# Patient Record
Sex: Male | Born: 1970 | Race: Black or African American | Hispanic: No | Marital: Single | Smoking: Former smoker
Health system: Southern US, Community
[De-identification: ages and names within clinical notes are randomized; demographics above are authoritative.]

## PROBLEM LIST (undated history)

## (undated) ENCOUNTER — Emergency Department (HOSPITAL_COMMUNITY): Discharge status: Absent without leave | Payer: No Typology Code available for payment source

## (undated) DIAGNOSIS — F419 Anxiety disorder, unspecified: Secondary | ICD-10-CM

## (undated) DIAGNOSIS — F32A Depression, unspecified: Secondary | ICD-10-CM

## (undated) DIAGNOSIS — W3400XA Accidental discharge from unspecified firearms or gun, initial encounter: Secondary | ICD-10-CM

## (undated) DIAGNOSIS — Y249XXA Unspecified firearm discharge, undetermined intent, initial encounter: Secondary | ICD-10-CM

## (undated) DIAGNOSIS — F329 Major depressive disorder, single episode, unspecified: Secondary | ICD-10-CM

## (undated) HISTORY — PX: OTHER SURGICAL HISTORY: SHX169

## (undated) HISTORY — PX: STOMACH FOREIGN BODY REMOVAL: SHX2449

---

## 1999-02-26 ENCOUNTER — Encounter: Payer: Self-pay | Admitting: Pulmonary Disease

## 1999-02-26 ENCOUNTER — Encounter: Payer: Self-pay | Admitting: General Surgery

## 1999-02-26 ENCOUNTER — Inpatient Hospital Stay (HOSPITAL_COMMUNITY): Admission: AD | Admit: 1999-02-26 | Discharge: 1999-03-09 | Payer: Self-pay

## 1999-02-27 ENCOUNTER — Encounter: Payer: Self-pay | Admitting: General Surgery

## 1999-02-28 ENCOUNTER — Encounter: Payer: Self-pay | Admitting: General Surgery

## 1999-03-01 ENCOUNTER — Encounter: Payer: Self-pay | Admitting: General Surgery

## 1999-03-03 ENCOUNTER — Encounter: Payer: Self-pay | Admitting: General Surgery

## 1999-03-04 ENCOUNTER — Encounter: Payer: Self-pay | Admitting: General Surgery

## 1999-03-06 ENCOUNTER — Encounter: Payer: Self-pay | Admitting: Surgery

## 1999-03-06 ENCOUNTER — Encounter: Payer: Self-pay | Admitting: General Surgery

## 1999-03-07 ENCOUNTER — Encounter: Payer: Self-pay | Admitting: General Surgery

## 1999-03-07 ENCOUNTER — Encounter: Payer: Self-pay | Admitting: Surgery

## 1999-04-05 ENCOUNTER — Emergency Department (HOSPITAL_COMMUNITY): Admission: EM | Admit: 1999-04-05 | Discharge: 1999-04-05 | Payer: Self-pay | Admitting: Emergency Medicine

## 1999-04-10 ENCOUNTER — Encounter: Admission: RE | Admit: 1999-04-10 | Discharge: 1999-04-15 | Payer: Self-pay | Admitting: Orthopedic Surgery

## 1999-04-30 ENCOUNTER — Encounter: Admission: RE | Admit: 1999-04-30 | Discharge: 1999-05-30 | Payer: Self-pay | Admitting: Orthopedic Surgery

## 1999-05-08 ENCOUNTER — Encounter: Payer: Self-pay | Admitting: *Deleted

## 1999-05-08 ENCOUNTER — Emergency Department (HOSPITAL_COMMUNITY): Admission: EM | Admit: 1999-05-08 | Discharge: 1999-05-08 | Payer: Self-pay | Admitting: *Deleted

## 2000-09-12 ENCOUNTER — Emergency Department (HOSPITAL_COMMUNITY): Admission: EM | Admit: 2000-09-12 | Discharge: 2000-09-13 | Payer: Self-pay | Admitting: Internal Medicine

## 2000-09-23 ENCOUNTER — Encounter: Admission: RE | Admit: 2000-09-23 | Discharge: 2000-12-22 | Payer: Self-pay

## 2010-09-20 ENCOUNTER — Emergency Department (HOSPITAL_COMMUNITY)
Admission: EM | Admit: 2010-09-20 | Discharge: 2010-09-20 | Disposition: A | Discharge status: Home / Self Care | Payer: Self-pay | Attending: Emergency Medicine | Admitting: Emergency Medicine

## 2010-09-20 DIAGNOSIS — K089 Disorder of teeth and supporting structures, unspecified: Secondary | ICD-10-CM | POA: Insufficient documentation

## 2010-09-20 DIAGNOSIS — K029 Dental caries, unspecified: Secondary | ICD-10-CM | POA: Insufficient documentation

## 2011-02-20 ENCOUNTER — Telehealth: Payer: Self-pay | Admitting: Dietician

## 2011-02-20 NOTE — Telephone Encounter (Signed)
Opened in error

## 2011-03-15 ENCOUNTER — Emergency Department (HOSPITAL_COMMUNITY)
Admission: EM | Admit: 2011-03-15 | Discharge: 2011-03-16 | Disposition: A | Discharge status: Home / Self Care | Payer: Self-pay | Attending: Emergency Medicine | Admitting: Emergency Medicine

## 2011-03-15 ENCOUNTER — Encounter (HOSPITAL_COMMUNITY): Payer: Self-pay | Admitting: Emergency Medicine

## 2011-03-15 DIAGNOSIS — M79609 Pain in unspecified limb: Secondary | ICD-10-CM | POA: Insufficient documentation

## 2011-03-15 DIAGNOSIS — L02419 Cutaneous abscess of limb, unspecified: Secondary | ICD-10-CM | POA: Insufficient documentation

## 2011-03-15 DIAGNOSIS — M129 Arthropathy, unspecified: Secondary | ICD-10-CM | POA: Insufficient documentation

## 2011-03-15 DIAGNOSIS — M7989 Other specified soft tissue disorders: Secondary | ICD-10-CM | POA: Insufficient documentation

## 2011-03-15 DIAGNOSIS — L0291 Cutaneous abscess, unspecified: Secondary | ICD-10-CM

## 2011-03-15 DIAGNOSIS — R61 Generalized hyperhidrosis: Secondary | ICD-10-CM | POA: Insufficient documentation

## 2011-03-15 DIAGNOSIS — L03119 Cellulitis of unspecified part of limb: Secondary | ICD-10-CM | POA: Insufficient documentation

## 2011-03-15 DIAGNOSIS — R609 Edema, unspecified: Secondary | ICD-10-CM | POA: Insufficient documentation

## 2011-03-15 MED ORDER — OXYCODONE-ACETAMINOPHEN 5-325 MG PO TABS
1.0000 | ORAL_TABLET | Freq: Once | ORAL | Status: AC
  Administered 2011-03-15: 1 via ORAL
  Filled 2011-03-15: qty 1

## 2011-03-15 MED ORDER — SODIUM CHLORIDE 0.9 % IV SOLN
INTRAVENOUS | Status: DC
  Administered 2011-03-15: via INTRAVENOUS
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
## 2011-03-16 ENCOUNTER — Emergency Department (HOSPITAL_COMMUNITY): Payer: Self-pay

## 2011-03-16 LAB — DIFFERENTIAL
Basophils Absolute: 0 10*3/uL (ref 0.0–0.1)
Basophils Relative: 0 % (ref 0–1)
Eosinophils Absolute: 0.4 10*3/uL (ref 0.0–0.7)
Eosinophils Relative: 7 % — ABNORMAL HIGH (ref 0–5)
Lymphocytes Relative: 51 % — ABNORMAL HIGH (ref 12–46)
Lymphs Abs: 3.3 10*3/uL (ref 0.7–4.0)
Monocytes Absolute: 0.5 10*3/uL (ref 0.1–1.0)
Monocytes Relative: 8 % (ref 3–12)
Neutro Abs: 2.2 10*3/uL (ref 1.7–7.7)
Neutrophils Relative %: 34 % — ABNORMAL LOW (ref 43–77)

## 2011-03-16 LAB — CBC
HCT: 36.4 % — ABNORMAL LOW (ref 39.0–52.0)
Hemoglobin: 12.3 g/dL — ABNORMAL LOW (ref 13.0–17.0)
MCH: 33.2 pg (ref 26.0–34.0)
MCHC: 33.8 g/dL (ref 30.0–36.0)
MCV: 98.1 fL (ref 78.0–100.0)
Platelets: 254 10*3/uL (ref 150–400)
RBC: 3.71 MIL/uL — ABNORMAL LOW (ref 4.22–5.81)
RDW: 11.8 % (ref 11.5–15.5)
WBC: 6.5 10*3/uL (ref 4.0–10.5)

## 2011-03-16 LAB — BASIC METABOLIC PANEL
CO2: 28 mEq/L (ref 19–32)
Calcium: 9.1 mg/dL (ref 8.4–10.5)
Chloride: 103 mEq/L (ref 96–112)
Creatinine, Ser: 1.04 mg/dL (ref 0.50–1.35)
Glucose, Bld: 86 mg/dL (ref 70–99)
Sodium: 137 mEq/L (ref 135–145)

## 2011-03-16 MED ORDER — OXYCODONE-ACETAMINOPHEN 5-325 MG PO TABS: 2.0000 | ORAL_TABLET | ORAL | Status: AC | PRN

## 2011-03-16 MED ORDER — IOHEXOL 350 MG/ML SOLN
100.0000 mL | Freq: Once | INTRAVENOUS | Status: AC | PRN
  Administered 2011-03-16: 100 mL via INTRAVENOUS

## 2011-03-16 NOTE — ED Notes (Signed)
Patient transported to CT 

## 2011-03-16 NOTE — ED Notes (Signed)
Patient states 2.5 weeks ago noticed right leg above the knee swollen and painful. ROM intact but painful. Rates pain 8/10 but worse with movement. Respiration even unlabored A&Ox3

## 2011-03-16 NOTE — ED Notes (Signed)
NAD, resp e/u, AOx4, pt states understanding of discharge instructions and denies questions

## 2011-05-04 ENCOUNTER — Other Ambulatory Visit: Payer: Self-pay | Admitting: Orthopedic Surgery

## 2011-05-25 ENCOUNTER — Encounter (HOSPITAL_BASED_OUTPATIENT_CLINIC_OR_DEPARTMENT_OTHER): Payer: Self-pay | Admitting: *Deleted

## 2011-05-27 ENCOUNTER — Encounter (HOSPITAL_BASED_OUTPATIENT_CLINIC_OR_DEPARTMENT_OTHER): Payer: Self-pay | Admitting: Anesthesiology

## 2011-05-29 ENCOUNTER — Encounter (HOSPITAL_BASED_OUTPATIENT_CLINIC_OR_DEPARTMENT_OTHER): Admission: RE | Payer: Self-pay | Source: Ambulatory Visit

## 2011-05-29 ENCOUNTER — Ambulatory Visit (HOSPITAL_BASED_OUTPATIENT_CLINIC_OR_DEPARTMENT_OTHER): Admission: RE | Admit: 2011-05-29 | Payer: Self-pay | Source: Ambulatory Visit | Admitting: Orthopedic Surgery

## 2011-05-29 ENCOUNTER — Encounter (HOSPITAL_BASED_OUTPATIENT_CLINIC_OR_DEPARTMENT_OTHER): Payer: Self-pay | Admitting: Anesthesiology

## 2011-05-29 HISTORY — DX: Accidental discharge from unspecified firearms or gun, initial encounter: W34.00XA

## 2011-05-29 HISTORY — DX: Depression, unspecified: F32.A

## 2011-05-29 HISTORY — DX: Unspecified firearm discharge, undetermined intent, initial encounter: Y24.9XXA

## 2011-05-29 HISTORY — DX: Major depressive disorder, single episode, unspecified: F32.9

## 2011-05-29 HISTORY — DX: Anxiety disorder, unspecified: F41.9

## 2011-05-29 SURGERY — REMOVAL, HARDWARE
Anesthesia: General | Laterality: Right

## 2011-09-07 ENCOUNTER — Emergency Department (HOSPITAL_COMMUNITY): Payer: Self-pay

## 2011-09-07 ENCOUNTER — Encounter (HOSPITAL_COMMUNITY): Payer: Self-pay | Admitting: Emergency Medicine

## 2011-09-07 ENCOUNTER — Emergency Department (HOSPITAL_COMMUNITY)
Admission: EM | Admit: 2011-09-07 | Discharge: 2011-09-07 | Disposition: A | Discharge status: Home / Self Care | Payer: Self-pay | Attending: Emergency Medicine | Admitting: Emergency Medicine

## 2011-09-07 DIAGNOSIS — M25569 Pain in unspecified knee: Secondary | ICD-10-CM | POA: Insufficient documentation

## 2011-09-07 DIAGNOSIS — M79609 Pain in unspecified limb: Secondary | ICD-10-CM | POA: Insufficient documentation

## 2011-09-07 DIAGNOSIS — F411 Generalized anxiety disorder: Secondary | ICD-10-CM | POA: Insufficient documentation

## 2011-09-07 DIAGNOSIS — Z79899 Other long term (current) drug therapy: Secondary | ICD-10-CM | POA: Insufficient documentation

## 2011-09-07 DIAGNOSIS — M25469 Effusion, unspecified knee: Secondary | ICD-10-CM | POA: Insufficient documentation

## 2011-09-07 DIAGNOSIS — F329 Major depressive disorder, single episode, unspecified: Secondary | ICD-10-CM | POA: Insufficient documentation

## 2011-09-07 DIAGNOSIS — F3289 Other specified depressive episodes: Secondary | ICD-10-CM | POA: Insufficient documentation

## 2011-09-07 MED ORDER — HYDROCODONE-ACETAMINOPHEN 5-500 MG PO TABS: 1.0000 | ORAL_TABLET | Freq: Four times a day (QID) | ORAL | Status: AC | PRN

## 2011-09-07 MED ORDER — HYDROCODONE-ACETAMINOPHEN 5-325 MG PO TABS
2.0000 | ORAL_TABLET | Freq: Once | ORAL | Status: AC
  Administered 2011-09-07: 2 via ORAL
  Filled 2011-09-07: qty 2
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
## 2011-11-24 ENCOUNTER — Encounter (HOSPITAL_COMMUNITY): Payer: Self-pay | Admitting: *Deleted

## 2011-11-24 ENCOUNTER — Emergency Department (HOSPITAL_COMMUNITY)
Admission: EM | Admit: 2011-11-24 | Discharge: 2011-11-25 | Disposition: A | Discharge status: Home / Self Care | Payer: Self-pay | Attending: Emergency Medicine | Admitting: Emergency Medicine

## 2011-11-24 DIAGNOSIS — Z87891 Personal history of nicotine dependence: Secondary | ICD-10-CM | POA: Insufficient documentation

## 2011-11-24 DIAGNOSIS — M25569 Pain in unspecified knee: Secondary | ICD-10-CM | POA: Insufficient documentation

## 2011-11-24 DIAGNOSIS — F3289 Other specified depressive episodes: Secondary | ICD-10-CM | POA: Insufficient documentation

## 2011-11-24 DIAGNOSIS — F329 Major depressive disorder, single episode, unspecified: Secondary | ICD-10-CM | POA: Insufficient documentation

## 2011-11-24 DIAGNOSIS — G8929 Other chronic pain: Secondary | ICD-10-CM | POA: Insufficient documentation

## 2011-11-24 DIAGNOSIS — Z880 Allergy status to penicillin: Secondary | ICD-10-CM | POA: Insufficient documentation

## 2011-11-24 DIAGNOSIS — F411 Generalized anxiety disorder: Secondary | ICD-10-CM | POA: Insufficient documentation
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
## 2011-11-25 NOTE — ED Notes (Addendum)
Pt left after seeing EDP.  Pt left without discharge papers and without signing discharge pad.
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
## 2012-05-30 ENCOUNTER — Encounter (HOSPITAL_BASED_OUTPATIENT_CLINIC_OR_DEPARTMENT_OTHER): Payer: Self-pay | Admitting: *Deleted

## 2012-05-30 ENCOUNTER — Emergency Department (HOSPITAL_BASED_OUTPATIENT_CLINIC_OR_DEPARTMENT_OTHER)
Admission: EM | Admit: 2012-05-30 | Discharge: 2012-05-30 | Disposition: A | Discharge status: Home / Self Care | Payer: Self-pay | Attending: Emergency Medicine | Admitting: Emergency Medicine

## 2012-05-30 DIAGNOSIS — Z79899 Other long term (current) drug therapy: Secondary | ICD-10-CM | POA: Insufficient documentation

## 2012-05-30 DIAGNOSIS — K029 Dental caries, unspecified: Secondary | ICD-10-CM

## 2012-05-30 DIAGNOSIS — Z87828 Personal history of other (healed) physical injury and trauma: Secondary | ICD-10-CM | POA: Insufficient documentation

## 2012-05-30 DIAGNOSIS — R110 Nausea: Secondary | ICD-10-CM | POA: Insufficient documentation

## 2012-05-30 DIAGNOSIS — F3289 Other specified depressive episodes: Secondary | ICD-10-CM | POA: Insufficient documentation

## 2012-05-30 DIAGNOSIS — F411 Generalized anxiety disorder: Secondary | ICD-10-CM | POA: Insufficient documentation

## 2012-05-30 DIAGNOSIS — K089 Disorder of teeth and supporting structures, unspecified: Secondary | ICD-10-CM | POA: Insufficient documentation

## 2012-05-30 DIAGNOSIS — Z87891 Personal history of nicotine dependence: Secondary | ICD-10-CM | POA: Insufficient documentation

## 2012-05-30 DIAGNOSIS — K0889 Other specified disorders of teeth and supporting structures: Secondary | ICD-10-CM

## 2012-05-30 DIAGNOSIS — F329 Major depressive disorder, single episode, unspecified: Secondary | ICD-10-CM | POA: Insufficient documentation

## 2012-05-30 MED ORDER — AMOXICILLIN 500 MG PO CAPS: 500.0000 mg | ORAL_CAPSULE | Freq: Three times a day (TID) | ORAL | Status: AC

## 2012-05-30 MED ORDER — ACETAMINOPHEN 500 MG PO TABS: 500.0000 mg | ORAL_TABLET | Freq: Four times a day (QID) | ORAL | Status: AC | PRN

## 2012-05-30 NOTE — ED Provider Notes (Signed)
Medical screening examination/treatment/procedure(s) were performed by non-physician practitioner and as supervising physician I was immediately available for consultation/collaboration.  Juliet Rude. Rubin Payor, MD 05/30/12 [phone]
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
## 2012-05-30 NOTE — ED Notes (Signed)
Pt c/o tooth pain x 2 days

## 2013-01-01 ENCOUNTER — Encounter (HOSPITAL_COMMUNITY): Payer: Self-pay | Admitting: Emergency Medicine

## 2013-01-01 ENCOUNTER — Emergency Department (HOSPITAL_COMMUNITY)
Admission: EM | Admit: 2013-01-01 | Discharge: 2013-01-02 | Disposition: A | Discharge status: Home / Self Care | Payer: Self-pay | Attending: Emergency Medicine | Admitting: Emergency Medicine

## 2013-01-01 DIAGNOSIS — S80811A Abrasion, right lower leg, initial encounter: Secondary | ICD-10-CM

## 2013-01-01 DIAGNOSIS — Z79899 Other long term (current) drug therapy: Secondary | ICD-10-CM | POA: Insufficient documentation

## 2013-01-01 DIAGNOSIS — S0993XA Unspecified injury of face, initial encounter: Secondary | ICD-10-CM | POA: Insufficient documentation

## 2013-01-01 DIAGNOSIS — S7000XA Contusion of unspecified hip, initial encounter: Secondary | ICD-10-CM | POA: Insufficient documentation

## 2013-01-01 DIAGNOSIS — S7001XA Contusion of right hip, initial encounter: Secondary | ICD-10-CM

## 2013-01-01 DIAGNOSIS — S0990XA Unspecified injury of head, initial encounter: Secondary | ICD-10-CM | POA: Insufficient documentation

## 2013-01-01 DIAGNOSIS — Y9389 Activity, other specified: Secondary | ICD-10-CM | POA: Insufficient documentation

## 2013-01-01 DIAGNOSIS — Z880 Allergy status to penicillin: Secondary | ICD-10-CM | POA: Insufficient documentation

## 2013-01-01 DIAGNOSIS — Y92410 Unspecified street and highway as the place of occurrence of the external cause: Secondary | ICD-10-CM | POA: Insufficient documentation

## 2013-01-01 DIAGNOSIS — S9031XA Contusion of right foot, initial encounter: Secondary | ICD-10-CM

## 2013-01-01 DIAGNOSIS — Z792 Long term (current) use of antibiotics: Secondary | ICD-10-CM | POA: Insufficient documentation

## 2013-01-01 DIAGNOSIS — F3289 Other specified depressive episodes: Secondary | ICD-10-CM | POA: Insufficient documentation

## 2013-01-01 DIAGNOSIS — IMO0002 Reserved for concepts with insufficient information to code with codable children: Secondary | ICD-10-CM | POA: Insufficient documentation

## 2013-01-01 DIAGNOSIS — S9030XA Contusion of unspecified foot, initial encounter: Secondary | ICD-10-CM | POA: Insufficient documentation

## 2013-01-01 DIAGNOSIS — F329 Major depressive disorder, single episode, unspecified: Secondary | ICD-10-CM | POA: Insufficient documentation

## 2013-01-01 DIAGNOSIS — Z8679 Personal history of other diseases of the circulatory system: Secondary | ICD-10-CM | POA: Insufficient documentation

## 2013-01-01 DIAGNOSIS — F411 Generalized anxiety disorder: Secondary | ICD-10-CM | POA: Insufficient documentation

## 2013-01-01 DIAGNOSIS — Z87891 Personal history of nicotine dependence: Secondary | ICD-10-CM | POA: Insufficient documentation
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
## 2013-01-02 ENCOUNTER — Emergency Department (HOSPITAL_COMMUNITY): Payer: Self-pay

## 2013-01-02 ENCOUNTER — Encounter (HOSPITAL_COMMUNITY): Payer: Self-pay | Admitting: Radiology

## 2013-01-02 MED ORDER — BACITRACIN 500 UNIT/GM EX OINT
1.0000 "application " | TOPICAL_OINTMENT | Freq: Two times a day (BID) | CUTANEOUS | Status: DC
  Administered 2013-01-02: 1 via TOPICAL

## 2013-01-02 MED ORDER — HYDROCODONE-ACETAMINOPHEN 5-325 MG PO TABS: 2.0000 | ORAL_TABLET | ORAL | Status: DC | PRN

## 2013-01-02 MED ORDER — OXYCODONE-ACETAMINOPHEN 5-325 MG PO TABS
2.0000 | ORAL_TABLET | Freq: Once | ORAL | Status: AC
  Administered 2013-01-02: 2 via ORAL
  Filled 2013-01-02: qty 2

## 2013-01-02 MED ORDER — NAPROXEN 500 MG PO TABS: 500.0000 mg | ORAL_TABLET | Freq: Two times a day (BID) | ORAL | Status: DC

## 2013-01-02 NOTE — ED Notes (Signed)
Ortho paged to fit for Crutches
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
## 2013-01-02 NOTE — ED Notes (Signed)
MD at bedside. 
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
## 2013-10-24 ENCOUNTER — Encounter (HOSPITAL_COMMUNITY): Payer: Self-pay | Admitting: Emergency Medicine

## 2013-10-24 ENCOUNTER — Emergency Department (HOSPITAL_COMMUNITY)
Admission: EM | Admit: 2013-10-24 | Discharge: 2013-10-24 | Disposition: A | Discharge status: Home / Self Care | Payer: No Typology Code available for payment source | Attending: Emergency Medicine | Admitting: Emergency Medicine

## 2013-10-24 DIAGNOSIS — S39012A Strain of muscle, fascia and tendon of lower back, initial encounter: Secondary | ICD-10-CM

## 2013-10-24 DIAGNOSIS — Z79899 Other long term (current) drug therapy: Secondary | ICD-10-CM | POA: Insufficient documentation

## 2013-10-24 DIAGNOSIS — F3289 Other specified depressive episodes: Secondary | ICD-10-CM | POA: Insufficient documentation

## 2013-10-24 DIAGNOSIS — S339XXA Sprain of unspecified parts of lumbar spine and pelvis, initial encounter: Secondary | ICD-10-CM | POA: Insufficient documentation

## 2013-10-24 DIAGNOSIS — Y939 Activity, unspecified: Secondary | ICD-10-CM | POA: Insufficient documentation

## 2013-10-24 DIAGNOSIS — F411 Generalized anxiety disorder: Secondary | ICD-10-CM | POA: Insufficient documentation

## 2013-10-24 DIAGNOSIS — Z880 Allergy status to penicillin: Secondary | ICD-10-CM | POA: Insufficient documentation

## 2013-10-24 DIAGNOSIS — X58XXXA Exposure to other specified factors, initial encounter: Secondary | ICD-10-CM | POA: Insufficient documentation

## 2013-10-24 DIAGNOSIS — S335XXA Sprain of ligaments of lumbar spine, initial encounter: Principal | ICD-10-CM

## 2013-10-24 DIAGNOSIS — Z87891 Personal history of nicotine dependence: Secondary | ICD-10-CM | POA: Insufficient documentation

## 2013-10-24 DIAGNOSIS — F329 Major depressive disorder, single episode, unspecified: Secondary | ICD-10-CM | POA: Insufficient documentation

## 2013-10-24 DIAGNOSIS — Z791 Long term (current) use of non-steroidal anti-inflammatories (NSAID): Secondary | ICD-10-CM | POA: Insufficient documentation

## 2013-10-24 DIAGNOSIS — Y929 Unspecified place or not applicable: Secondary | ICD-10-CM | POA: Insufficient documentation

## 2013-10-24 DIAGNOSIS — Z792 Long term (current) use of antibiotics: Secondary | ICD-10-CM | POA: Insufficient documentation

## 2013-10-24 DIAGNOSIS — Z87828 Personal history of other (healed) physical injury and trauma: Secondary | ICD-10-CM | POA: Insufficient documentation

## 2013-10-24 DIAGNOSIS — M79609 Pain in unspecified limb: Secondary | ICD-10-CM | POA: Insufficient documentation

## 2013-10-24 MED ORDER — NAPROXEN 500 MG PO TABS: 500.0000 mg | ORAL_TABLET | Freq: Two times a day (BID) | ORAL | Status: AC

## 2013-10-24 MED ORDER — HYDROCODONE-ACETAMINOPHEN 5-325 MG PO TABS: 2.0000 | ORAL_TABLET | ORAL | Status: AC | PRN

## 2013-10-24 NOTE — Discharge Instructions (Signed)
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
## 2013-10-25 NOTE — ED Provider Notes (Signed)
Medical screening examination/treatment/procedure(s) were performed by non-physician practitioner and as supervising physician I was immediately available for consultation/collaboration.   EKG Interpretation None       Jackye Dever, MD 10/25/13 0111 

## 2013-10-30 ENCOUNTER — Encounter (HOSPITAL_COMMUNITY): Payer: Self-pay | Admitting: Emergency Medicine

## 2013-10-30 ENCOUNTER — Emergency Department (HOSPITAL_COMMUNITY)
Admission: EM | Admit: 2013-10-30 | Discharge: 2013-10-30 | Disposition: A | Discharge status: Home / Self Care | Payer: No Typology Code available for payment source | Attending: Emergency Medicine | Admitting: Emergency Medicine

## 2013-10-30 DIAGNOSIS — F411 Generalized anxiety disorder: Secondary | ICD-10-CM | POA: Insufficient documentation

## 2013-10-30 DIAGNOSIS — Y9289 Other specified places as the place of occurrence of the external cause: Secondary | ICD-10-CM | POA: Insufficient documentation

## 2013-10-30 DIAGNOSIS — R21 Rash and other nonspecific skin eruption: Secondary | ICD-10-CM | POA: Insufficient documentation

## 2013-10-30 DIAGNOSIS — Z792 Long term (current) use of antibiotics: Secondary | ICD-10-CM | POA: Insufficient documentation

## 2013-10-30 DIAGNOSIS — Z87891 Personal history of nicotine dependence: Secondary | ICD-10-CM | POA: Insufficient documentation

## 2013-10-30 DIAGNOSIS — F3289 Other specified depressive episodes: Secondary | ICD-10-CM | POA: Insufficient documentation

## 2013-10-30 DIAGNOSIS — Z87828 Personal history of other (healed) physical injury and trauma: Secondary | ICD-10-CM | POA: Insufficient documentation

## 2013-10-30 DIAGNOSIS — Z79899 Other long term (current) drug therapy: Secondary | ICD-10-CM | POA: Insufficient documentation

## 2013-10-30 DIAGNOSIS — Y9389 Activity, other specified: Secondary | ICD-10-CM | POA: Insufficient documentation

## 2013-10-30 DIAGNOSIS — W57XXXA Bitten or stung by nonvenomous insect and other nonvenomous arthropods, initial encounter: Secondary | ICD-10-CM

## 2013-10-30 DIAGNOSIS — S90569A Insect bite (nonvenomous), unspecified ankle, initial encounter: Secondary | ICD-10-CM | POA: Insufficient documentation

## 2013-10-30 DIAGNOSIS — Z791 Long term (current) use of non-steroidal anti-inflammatories (NSAID): Secondary | ICD-10-CM | POA: Insufficient documentation

## 2013-10-30 DIAGNOSIS — Z880 Allergy status to penicillin: Secondary | ICD-10-CM | POA: Insufficient documentation

## 2013-10-30 DIAGNOSIS — F329 Major depressive disorder, single episode, unspecified: Secondary | ICD-10-CM | POA: Insufficient documentation

## 2013-10-30 MED ORDER — PERMETHRIN 5 % EX CREA: TOPICAL_CREAM | CUTANEOUS | Status: AC

## 2013-10-30 NOTE — ED Notes (Signed)
Per pt sts red bumps on BLE and spreading. sts recently started taking medication for pain, naproxen and hydrocodone. sts has taken these meds before with no problem.

## 2013-10-30 NOTE — Discharge Instructions (Signed)
Bedbugs  Bedbugs are tiny bugs that live in and around beds. During the day, they hide in mattresses and other places near beds. They come out at night and bite people lying in bed. They need blood to live and grow. Bedbugs can be found in beds anywhere. Usually, they are found in places where many people come and go (hotels, shelters, hospitals). It does not matter whether the place is dirty or clean.  Getting bitten by bedbugs rarely causes a medical problem. The biggest problem can be getting rid of them.  This often takes the work of a pest control expert.  CAUSES  · Less use of pesticides. Bedbugs were common before the 1950s. Then, strong pesticides such as DDT nearly wiped them out. Today, these pesticides are not used because they harm the environment and can cause health problems.  · More travel. Besides mattresses, bedbugs can also live in clothing and luggage. They can come along as people travel from place to place. Bedbugs are more common in certain parts of the world. When people travel to those areas, the bugs can come home with them.  · Presence of birds and bats. Bedbugs often infest birds and bats. If you have these animals in or near your home, bedbugs may infest your house, too.  SYMPTOMS  It does not hurt to be bitten by a bedbug. You will probably not wake up when you are bitten. Bedbugs usually bite areas of the skin that are not covered. Symptoms may show when you wake up, or they may take a day or more to show up. Symptoms may include:  · Small red bumps on the skin. These might be lined up in a row or clustered in a group.  · A darker red dot in the middle of red bumps.  · Blisters on the skin. There may be swelling and very bad itching. These may be signs of an allergic reaction. This does not happen often.  DIAGNOSIS  Bedbug bites might look and feel like other types of insect bites. The bugs do not stay on the body like ticks or lice. They bite, drop off, and crawl away to hide. Your  caregiver will probably:  · Ask about your symptoms.  · Ask about your recent activities and travel.  · Check your skin for bedbug bites.  · Ask you to check at home for signs of bedbugs. You should look for:  ¨ Spots or stains on the bed or nearby. This could be from bedbugs that were crushed or from their eggs or waste.  ¨ Bedbugs themselves. They are reddish-brown, oval, and flat. They do not fly. They are about the size of an apple seed.  · Places to look for bedbugs include:  ¨ Beds. Check mattresses, headboards, box springs, and bed frames.  ¨ On drapes and curtains near the bed.  ¨ Under carpeting in the bedroom.  ¨ Behind electrical outlets.  ¨ Behind any wallpaper that is peeling.  ¨ Inside luggage.  TREATMENT  Most bedbug bites do not need treatment. They usually go away on their own in a few days. The bites are not dangerous. However, treatment may be needed if you have scratched so much that your skin has become infected. You may also need treatment if you are allergic to bedbug bites. Treatment options include:  · A drug that stops swelling and itching (corticosteroid). Usually, a cream is rubbed on the skin. If you have a bad rash, you may be   given a corticosteroid pill.  · Oral antihistamines. These are pills to help control itching.  · Antibiotic medicines. An antibiotic may be prescribed for infected skin.  HOME CARE INSTRUCTIONS   · Take any medicine prescribed by your caregiver for your bites. Follow the directions carefully.  · Consider wearing pajamas with long sleeves and pant legs.  · Your bedroom may need to be treated. A pest control expert should make sure the bedbugs are gone. You may need to throw away mattresses or luggage. Ask the pest control expert what you can do to keep the bedbugs from coming back. Common suggestions include:  ¨ Putting a plastic cover over your mattress.  ¨ Washing and drying your clothes and bedding in hot water and a hot dryer. The temperature should be hotter  than 120° F (48.9° C). Bedbugs are killed by high temperatures.  ¨ Vacuuming carefully all around your bed. Vacuum in all cracks and crevices where the bugs might hide. Do this often.  ¨ Carefully checking all used furniture, bedding, or clothes that you bring into your house.  ¨ Eliminating bird nests and bat roosts.  · If you get bedbug bites when traveling, check all your possessions carefully before bringing them into your house. If you find any bugs on clothes or in your luggage, consider throwing those items away.  SEEK MEDICAL CARE IF:  · You have red bug bites that keep coming back.  · You have red bug bites that itch badly.  · You have bug bites that cause a skin rash.  · You have scratch marks that are red and sore.  SEEK IMMEDIATE MEDICAL CARE IF:  You have a fever.  Document Released: 03/14/2010 Document Revised: 05/04/2011 Document Reviewed: 03/14/2010  [employer]® Patient Information ©2015 [employer]. This information is not intended to replace advice given to you by your health care provider. Make sure you discuss any questions you have with your health care provider.
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
## 2013-11-01 NOTE — ED Provider Notes (Signed)
Medical screening examination/treatment/procedure(s) were performed by non-physician practitioner and as supervising physician I was immediately available for consultation/collaboration.    Vida Roller, MD 11/01/13 [phone]
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
